# Patient Record
Sex: Female | Born: 1995 | Race: Black or African American | Hispanic: No | Marital: Single | Smoking: Never smoker
Health system: Southern US, Community
[De-identification: ages and names within clinical notes are randomized; demographics above are authoritative.]

## PROBLEM LIST (undated history)

## (undated) DIAGNOSIS — J45909 Unspecified asthma, uncomplicated: Secondary | ICD-10-CM

## (undated) DIAGNOSIS — F419 Anxiety disorder, unspecified: Secondary | ICD-10-CM

---

## 2017-05-23 ENCOUNTER — Emergency Department (HOSPITAL_BASED_OUTPATIENT_CLINIC_OR_DEPARTMENT_OTHER): Payer: 59

## 2017-05-23 ENCOUNTER — Encounter (HOSPITAL_BASED_OUTPATIENT_CLINIC_OR_DEPARTMENT_OTHER): Payer: Self-pay | Admitting: *Deleted

## 2017-05-23 ENCOUNTER — Other Ambulatory Visit: Payer: Self-pay

## 2017-05-23 ENCOUNTER — Emergency Department (HOSPITAL_BASED_OUTPATIENT_CLINIC_OR_DEPARTMENT_OTHER)
Admission: EM | Admit: 2017-05-23 | Discharge: 2017-05-23 | Disposition: A | Discharge status: Home / Self Care | Payer: 59 | Attending: Emergency Medicine | Admitting: Emergency Medicine

## 2017-05-23 DIAGNOSIS — F419 Anxiety disorder, unspecified: Secondary | ICD-10-CM | POA: Insufficient documentation

## 2017-05-23 DIAGNOSIS — J45909 Unspecified asthma, uncomplicated: Secondary | ICD-10-CM | POA: Diagnosis not present

## 2017-05-23 DIAGNOSIS — R0602 Shortness of breath: Secondary | ICD-10-CM | POA: Diagnosis not present

## 2017-05-23 HISTORY — DX: Anxiety disorder, unspecified: F41.9

## 2017-05-23 HISTORY — DX: Unspecified asthma, uncomplicated: J45.909

## 2017-05-23 LAB — BASIC METABOLIC PANEL
ANION GAP: 7 (ref 5–15)
BUN: 9 mg/dL (ref 6–20)
CO2: 23 mmol/L (ref 22–32)
Calcium: 9.2 mg/dL (ref 8.9–10.3)
Chloride: 109 mmol/L (ref 101–111)
Creatinine, Ser: 0.83 mg/dL (ref 0.44–1.00)
GFR calc Af Amer: >60 mL/min (ref >60)
GLUCOSE: 88 mg/dL (ref 65–99)
POTASSIUM: 3.4 mmol/L — AB (ref 3.5–5.1)
Sodium: 139 mmol/L (ref 135–145)

## 2017-05-23 LAB — CBC
HEMATOCRIT: 34.2 % — AB (ref 36.0–46.0)
HEMOGLOBIN: 11.8 g/dL — AB (ref 12.0–15.0)
MCH: 31.5 pg (ref 26.0–34.0)
MCHC: 34.5 g/dL (ref 30.0–36.0)
MCV: 91.2 fL (ref 78.0–100.0)
Platelets: 196 10*3/uL (ref 150–400)
RBC: 3.75 MIL/uL — AB (ref 3.87–5.11)
RDW: 12.1 % (ref 11.5–15.5)
WBC: 5.1 10*3/uL (ref 4.0–10.5)

## 2017-05-23 LAB — D-DIMER, QUANTITATIVE: D-Dimer, Quant: <0.27 ug/mL-FEU (ref 0.00–0.50)

## 2017-05-23 LAB — PREGNANCY, URINE: PREG TEST UR: NEGATIVE

## 2017-05-23 NOTE — ED Provider Notes (Signed)
[facility] EMERGENCY DEPARTMENT Provider Note   CSN: [id number] Arrival date & time: 05/23/17  [id number]     History   Chief Complaint Chief Complaint  Patient presents with  . Shortness of Breath    HPI Carla Cochran is a 21 y.o. female.  Patient presents to the ER for evaluation of shortness of breath.  Patient reports that symptoms have been ongoing for 1 or 2 weeks.  She reports that she gets very short of breath at times, sometimes with exertion, sometimes when she is not doing anything.  She does report that she has had asthma in the past, sometimes she does feel like she is wheezing.  She recently stopped Lexapro and BuSpar, has a history of anxiety disorder.  She is on Depo-Provera, does not take any exogenous estrogen.  She has not had any recent long car rides or plane rides, denies calf pain and swelling.      Past Medical History:  Diagnosis Date  . Anxiety   . Asthma     There are no active problems to display for this patient.   History reviewed. No pertinent surgical history.  OB History    No data available       Home Medications    Prior to Admission medications   Not on File    Family History No family history on file.  Social History Social History   Tobacco Use  . Smoking status: Never Smoker  Substance Use Topics  . Alcohol use: No    Frequency: Never  . Drug use: No     Allergies   Patient has no known allergies.   Review of Systems Review of Systems  Respiratory: Positive for chest tightness and shortness of breath.   Psychiatric/Behavioral: The patient is nervous/anxious.   All other systems reviewed and are negative.    Physical Exam Updated Vital Signs BP 134/74   Pulse 66   Temp 98.4 F (36.9 C)   Resp 18   Ht 5\' 6"  (1.676 m)   Wt 57.6 kg (127 lb)   SpO2 100%   BMI 20.50 kg/m   Physical Exam  Constitutional: She is oriented to person, place, and time. She appears well-developed and  well-nourished. No distress.  HENT:  Head: Normocephalic and atraumatic.  Right Ear: Hearing normal.  Left Ear: Hearing normal.  Nose: Nose normal.  Mouth/Throat: Oropharynx is clear and moist and mucous membranes are normal.  Eyes: Conjunctivae and EOM are normal. Pupils are equal, round, and reactive to light.  Neck: Normal range of motion. Neck supple.  Cardiovascular: Regular rhythm, S1 normal and S2 normal. Exam reveals no gallop and no friction rub.  No murmur heard. Pulmonary/Chest: Effort normal and breath sounds normal. No respiratory distress. She exhibits no tenderness.  Abdominal: Soft. Normal appearance and bowel sounds are normal. There is no hepatosplenomegaly. There is no tenderness. There is no rebound, no guarding, no tenderness at McBurney's point and negative Murphy's sign. No hernia.  Musculoskeletal: Normal range of motion.  Neurological: She is alert and oriented to person, place, and time. She has normal strength. No cranial nerve deficit or sensory deficit. Coordination normal. GCS eye subscore is 4. GCS verbal subscore is 5. GCS motor subscore is 6.  Skin: Skin is warm, dry and intact. No rash noted. No cyanosis.  Psychiatric: She has a normal mood and affect. Her speech is normal and behavior is normal. Thought content normal.  Nursing note and vitals reviewed.  ED Treatments / Results  Labs (all labs ordered are listed, but only abnormal results are displayed) Labs Reviewed  CBC - Abnormal; Notable for the following components:      Result Value   RBC 3.75 (*)    Hemoglobin 11.8 (*)    HCT 34.2 (*)    All other components within normal limits  PREGNANCY, URINE  D-DIMER, QUANTITATIVE (NOT AT [facility])  BASIC METABOLIC PANEL    EKG  EKG Interpretation  Date/Time:  Thursday May 23 2017 02:31:16 EST Ventricular Rate:  61 PR Interval:    QRS Duration: 81 QT Interval:  417 QTC Calculation: 420 R Axis:   88 Text Interpretation:  Sinus rhythm  Normal ECG Confirmed by Gilda CreasePollina, Christopher J [id number]) on 05/23/2017 2:36:15 AM       Radiology Dg Chest 2 View  Result Date: 05/23/2017 CLINICAL DATA:  21 year old female with shortness of breath. EXAM: CHEST  2 VIEW COMPARISON:  None. FINDINGS: The heart size and mediastinal contours are within normal limits. Both lungs are clear. The visualized skeletal structures are unremarkable. IMPRESSION: No active cardiopulmonary disease. Electronically Signed   By: Elgie CollardArash  Radparvar M.D.   On: 05/23/2017 02:23    Procedures Procedures (including critical care time)  Medications Ordered in ED Medications - No data to display   Initial Impression / Assessment and Plan / ED Course  I have reviewed the triage vital signs and the nursing notes.  Pertinent labs & imaging results that were available during my care of the patient were reviewed by me and considered in my medical decision making (see chart for details).     She presents with complaints of shortness of breath.  Patient has been experiencing intermittent episodes of shortness of breath as well as chest tightness.  At times she has had pain in her back as well.  Patient appears comfortable upon arrival.  Her vital signs are normal.  No hypoxia.  No tachycardia.  D-dimer is negative.  She does not have any PE risk factors.  Likewise she does not have any cardiac risk factors.  EKG is normal.  Chest x-ray is clear, no evidence of pulmonary pathology.  Patient likely experiencing withdrawal and recurrent anxiety secondary to stopping her BuSpar and Lexapro.  Patient advised that she should restart her medications and follow-up with her prescriber.  Final Clinical Impressions(s) / ED Diagnoses   Final diagnoses:  Shortness of breath  Anxiety    ED Discharge Orders    None       Pollina, Canary Brimhristopher J, MD 05/23/17 [phone]

## 2017-05-23 NOTE — ED Notes (Addendum)
C/o sharp back pain ,cough , sob and chest tightness x 1 week

## 2017-05-23 NOTE — ED Triage Notes (Addendum)
Pt c/o SOb and chest tightness x 1 week , Pt states stopped lexapro and Buspar x 2 weeks ago , c/o mind racing , unable to sleep. " Im nervous"

## 2017-07-29 ENCOUNTER — Emergency Department (HOSPITAL_COMMUNITY): Payer: 59

## 2017-07-29 ENCOUNTER — Encounter (HOSPITAL_COMMUNITY): Payer: Self-pay | Admitting: Emergency Medicine

## 2017-07-29 ENCOUNTER — Emergency Department (HOSPITAL_COMMUNITY)
Admission: EM | Admit: 2017-07-29 | Discharge: 2017-07-29 | Disposition: A | Discharge status: Home / Self Care | Payer: 59 | Attending: Emergency Medicine | Admitting: Emergency Medicine

## 2017-07-29 DIAGNOSIS — R0602 Shortness of breath: Secondary | ICD-10-CM | POA: Insufficient documentation

## 2017-07-29 DIAGNOSIS — Z79899 Other long term (current) drug therapy: Secondary | ICD-10-CM | POA: Insufficient documentation

## 2017-07-29 DIAGNOSIS — R0981 Nasal congestion: Secondary | ICD-10-CM | POA: Diagnosis not present

## 2017-07-29 DIAGNOSIS — R05 Cough: Secondary | ICD-10-CM | POA: Diagnosis not present

## 2017-07-29 DIAGNOSIS — R059 Cough, unspecified: Secondary | ICD-10-CM

## 2017-07-29 DIAGNOSIS — J45909 Unspecified asthma, uncomplicated: Secondary | ICD-10-CM | POA: Diagnosis not present

## 2017-07-29 LAB — BASIC METABOLIC PANEL
ANION GAP: 12 (ref 5–15)
BUN: 6 mg/dL (ref 6–20)
CHLORIDE: 109 mmol/L (ref 101–111)
CO2: 18 mmol/L — AB (ref 22–32)
Calcium: 9.3 mg/dL (ref 8.9–10.3)
Creatinine, Ser: 0.7 mg/dL (ref 0.44–1.00)
GFR calc Af Amer: >60 mL/min (ref >60)
GLUCOSE: 88 mg/dL (ref 65–99)
Potassium: 3.2 mmol/L — ABNORMAL LOW (ref 3.5–5.1)
Sodium: 139 mmol/L (ref 135–145)

## 2017-07-29 LAB — CBC
HEMATOCRIT: 35.3 % — AB (ref 36.0–46.0)
HEMOGLOBIN: 12.3 g/dL (ref 12.0–15.0)
MCH: 31.1 pg (ref 26.0–34.0)
MCHC: 34.8 g/dL (ref 30.0–36.0)
MCV: 89.4 fL (ref 78.0–100.0)
Platelets: 268 10*3/uL (ref 150–400)
RBC: 3.95 MIL/uL (ref 3.87–5.11)
RDW: 12.3 % (ref 11.5–15.5)
WBC: 6.1 10*3/uL (ref 4.0–10.5)

## 2017-07-29 LAB — I-STAT BETA HCG BLOOD, ED (MC, WL, AP ONLY): I-stat hCG, quantitative: <5 m[IU]/mL (ref <5)

## 2017-07-29 MED ORDER — ALBUTEROL SULFATE HFA 108 (90 BASE) MCG/ACT IN AERS
2.0000 | INHALATION_SPRAY | Freq: Once | RESPIRATORY_TRACT | Status: AC
  Administered 2017-07-29: 2 via RESPIRATORY_TRACT
  Filled 2017-07-29: qty 6.7

## 2017-07-29 MED ORDER — CETIRIZINE HCL 10 MG PO TABS: 10.0000 mg | ORAL_TABLET | Freq: Every day | ORAL | 1 refills | Status: AC

## 2017-07-29 MED ORDER — FLUTICASONE PROPIONATE 50 MCG/ACT NA SUSP: 1.0000 | Freq: Every day | NASAL | 2 refills | Status: AC

## 2017-07-29 MED ORDER — BENZONATATE 100 MG PO CAPS: 100.0000 mg | ORAL_CAPSULE | Freq: Three times a day (TID) | ORAL | 0 refills | Status: AC

## 2017-07-29 MED ORDER — ALBUTEROL SULFATE HFA 108 (90 BASE) MCG/ACT IN AERS: 1.0000 | INHALATION_SPRAY | Freq: Four times a day (QID) | RESPIRATORY_TRACT | 0 refills | Status: AC | PRN

## 2017-07-29 NOTE — ED Triage Notes (Signed)
Pt reports shortness of breath, cp for a couple weeks. States seen at multiple places for the same and diagnosed with post nasal drip.   PCP [facility].

## 2017-07-29 NOTE — ED Notes (Signed)
Pt verbalizes understanding of d/c instructions. Pt received prescriptions. Pt ambulatory at d/c with all belongings.  

## 2017-07-29 NOTE — ED Provider Notes (Signed)
[facility] Provider Note   CSN: [id number] Arrival date & time: 07/29/17  2131     History   Chief Complaint Chief Complaint  Patient presents with  . Shortness of Breath    HPI Carla Cochran is a 22 y.o. female.  The history is provided by the patient and medical records.  Shortness of Breath  Associated symptoms include cough.     22 year old female with history of anxiety, asthma, presenting to the ED with shortness of breath.  States this is been intermittent over the past month or so.  States she has been seen at multiple facilities without significant relief.  States her last visit with urgent care she was prescribed some nasal spray which seemed to help a little.  States she did try taking Mucinex and Benadryl, however both of these caused adverse side effects and made her even more anxious and nervous.  States she does have some chest tightness when her shortness of breath is at its worse (mostly with exposure to cold air or exertional activities).  She denies any fever.  Has had a dry cough.  No hemoptysis.  No nausea or vomiting.  She has no known cardiac history.  States she has not been on medications for asthma in several years and does not remember much about this in general.  Past Medical History:  Diagnosis Date  . Anxiety   . Asthma     There are no active problems to display for this patient.   History reviewed. No pertinent surgical history.  OB History    No data available       Home Medications    Prior to Admission medications   Medication Sig Start Date End Date Taking? Authorizing Provider  ibuprofen (ADVIL,MOTRIN) 200 MG tablet Take 200-400 mg by mouth every 6 (six) hours as needed for headache or mild pain.   Yes [provider]  medroxyPROGESTERone (DEPO-PROVERA) 150 MG/ML injection Inject 150 mg into the muscle every 3 (three) months.   Yes [provider]  Multiple Vitamins-Calcium  (ONE-A-DAY WOMENS PO) Take 1 tablet by mouth daily.    Yes [provider]    Family History History reviewed. No pertinent family history.  Social History Social History   Tobacco Use  . Smoking status: Never Smoker  Substance Use Topics  . Alcohol use: No    Frequency: Never  . Drug use: No     Allergies   Patient has no known allergies.   Review of Systems Review of Systems  HENT: Positive for congestion and sneezing.   Respiratory: Positive for cough and shortness of breath.   All other systems reviewed and are negative.    Physical Exam Updated Vital Signs BP 126/70 (BP Location: Right Arm)   Pulse 77   Temp 97.8 F (36.6 C) (Oral)   Resp 16   Ht 5\' 6"  (1.676 m)   Wt 57.6 kg (127 lb)   SpO2 100%   BMI 20.50 kg/m   Physical Exam  Constitutional: She is oriented to person, place, and time. She appears well-developed and well-nourished.  HENT:  Head: Normocephalic and atraumatic.  Right Ear: Tympanic membrane and ear canal normal.  Left Ear: Tympanic membrane and ear canal normal.  Nose: Mucosal edema present.  Mouth/Throat: Uvula is midline, oropharynx is clear and moist and mucous membranes are normal.  Nasal congestion with PND  Eyes: Conjunctivae and EOM are normal. Pupils are equal, round, and reactive to  light.  Neck: Normal range of motion.  Cardiovascular: Normal rate, regular rhythm and normal heart sounds.  Pulmonary/Chest: Effort normal and breath sounds normal. She has no decreased breath sounds. She has no wheezes. She has no rhonchi.  Abdominal: Soft. Bowel sounds are normal.  Musculoskeletal: Normal range of motion.  Neurological: She is alert and oriented to person, place, and time.  Skin: Skin is warm and dry.  Psychiatric: She has a normal mood and affect.  Nursing note and vitals reviewed.    ED Treatments / Results  Labs (all labs ordered are listed, but only abnormal results are displayed) Labs Reviewed  BASIC  METABOLIC PANEL - Abnormal; Notable for the following components:      Result Value   Potassium 3.2 (*)    CO2 18 (*)    All other components within normal limits  CBC - Abnormal; Notable for the following components:   HCT 35.3 (*)    All other components within normal limits  I-STAT BETA HCG BLOOD, ED ([facility], [facility], [facility] ONLY)    EKG  EKG Interpretation None       Radiology Dg Chest 2 View  Result Date: 07/29/2017 CLINICAL DATA:  Initial evaluation for acute chest pain, shortness of breath. EXAM: CHEST  2 VIEW COMPARISON:  Prior radiograph from 07/23/2016. FINDINGS: The cardiac and mediastinal silhouettes are stable in size and contour, and remain within normal limits. The lungs are normally inflated. No airspace consolidation, pleural effusion, or pulmonary edema is identified. There is no pneumothorax. No acute osseous abnormality identified. IMPRESSION: No active cardiopulmonary disease. Electronically Signed   By: Rise Mu M.D.   On: 07/29/2017 22:21    Procedures Procedures (including critical care time)  Medications Ordered in ED Medications  albuterol (PROVENTIL HFA;VENTOLIN HFA) 108 (90 Base) MCG/ACT inhaler 2 puff (not administered)     Initial Impression / Assessment and Plan / ED Course  I have reviewed the triage vital signs and the nursing notes.  Pertinent labs & imaging results that were available during my care of the patient were reviewed by me and considered in my medical decision making (see chart for details).  22 year old female with intermittent shortness of breath and chest tightness over the past month.  Has been seen multiple times for same.  Does have history of asthma in childhood, not currently on any medications for this.  Does report symptoms are worse with cold exposure and exertional activities.  She is afebrile and nontoxic.  Her exam is benign aside from some nasal congestion and postnasal drip.  Her lungs are clear without wheeze or  rhonchi.  Her vitals are stable.  Labs reviewed, mild hypokalemia.  Chest x-ray is clear.  EKG is nonischemic.  She has no tachycardia or hypoxia to suggest acute PE.  Suspect her symptoms may be related to bronchospasm +/- cold symptoms.  Will treat symptomatically.  Does not currently have PCP-- recommended follow-up with wellness clinic.  Discussed plan with patient, she acknowledged understanding and agreed with plan of care.  Return precautions given for new or worsening symptoms.  Final Clinical Impressions(s) / ED Diagnoses   Final diagnoses:  Shortness of breath  Nasal congestion  Cough    ED Discharge Orders        Ordered    benzonatate (TESSALON) 100 MG capsule  Every 8 hours     07/29/17 2326    cetirizine (ZYRTEC ALLERGY) 10 MG tablet  Daily     07/29/17 2326  fluticasone (FLONASE) 50 MCG/ACT nasal spray  Daily     07/29/17 2326    albuterol (PROVENTIL HFA;VENTOLIN HFA) 108 (90 Base) MCG/ACT inhaler  Every 6 hours PRN     07/29/17 2326       Garlon Hatchet, PA-C 07/29/17 2349    Charlynne Pander, MD 07/30/17 [phone]

## 2017-07-29 NOTE — ED Notes (Signed)
ED Provider at bedside. 

## 2017-07-29 NOTE — Discharge Instructions (Signed)
Take the prescribed medication as directed. Follow-up with the wellness clinic if you continue having ongoing issues-- you can call to set up appt. Return to the ED for new or worsening symptoms.

## 2018-04-18 ENCOUNTER — Emergency Department (HOSPITAL_COMMUNITY)
Admission: EM | Admit: 2018-04-18 | Discharge: 2018-04-18 | Disposition: A | Discharge status: Home / Self Care | Payer: 59 | Attending: Emergency Medicine | Admitting: Emergency Medicine

## 2018-04-18 ENCOUNTER — Emergency Department (HOSPITAL_COMMUNITY): Payer: 59

## 2018-04-18 ENCOUNTER — Other Ambulatory Visit: Payer: Self-pay

## 2018-04-18 ENCOUNTER — Encounter (HOSPITAL_COMMUNITY): Payer: Self-pay | Admitting: Emergency Medicine

## 2018-04-18 DIAGNOSIS — J45909 Unspecified asthma, uncomplicated: Secondary | ICD-10-CM | POA: Insufficient documentation

## 2018-04-18 DIAGNOSIS — M25562 Pain in left knee: Secondary | ICD-10-CM | POA: Diagnosis not present

## 2018-04-18 DIAGNOSIS — Z79899 Other long term (current) drug therapy: Secondary | ICD-10-CM | POA: Insufficient documentation

## 2018-04-18 NOTE — ED Notes (Signed)
Patient given discharge teaching and verbalized understanding. Patient ambulated out of ED with a steady gait. 

## 2018-04-18 NOTE — ED Triage Notes (Signed)
Patient arrived by self from home. Pt c/o of knee pain that started yesterday. Pt doesn't remember anything that could have caused the pain. No hx of other health problems.

## 2018-04-18 NOTE — ED Provider Notes (Signed)
[facility]-EMERGENCY DEPT Provider Note   CSN: [id number] Arrival date & time: 04/18/18  1258     History   Chief Complaint Chief Complaint  Patient presents with  . Knee Pain    HPI Carla Cochran is a 22 y.o. female past medical history significant for anxiety who presents for evaluation of knee pain x1 day.  Patient states that she has pain to the medial aspect of her left knee.  Pain is intermittent in nature.  Pain is worse when she stands or walks on her knee.  States she has been working more than normal and constantly walks at her job for 9 hours a day.  States she was walking yesterday and she did twist however she did not feel pain at that time.  She has not taken anything for pain.  She rates her current pain a 0/10.  Pain does not radiate.  Denies fever, chills, swelling, redness or warmth to left lower extremity.  She states that when she does have pain it is resolved when she sits down and does not walk.  HPI  Past Medical History:  Diagnosis Date  . Anxiety   . Asthma     There are no active problems to display for this patient.   History reviewed. No pertinent surgical history.   OB History   None      Home Medications    Prior to Admission medications   Medication Sig Start Date End Date Taking? Authorizing Provider  albuterol (PROVENTIL HFA;VENTOLIN HFA) 108 (90 Base) MCG/ACT inhaler Inhale 1-2 puffs into the lungs every 6 (six) hours as needed for wheezing. 07/29/17   Garlon Hatchet, PA-C  benzonatate (TESSALON) 100 MG capsule Take 1 capsule (100 mg total) by mouth every 8 (eight) hours. 07/29/17   Garlon Hatchet, PA-C  cetirizine (ZYRTEC ALLERGY) 10 MG tablet Take 1 tablet (10 mg total) by mouth daily. 07/29/17   Garlon Hatchet, PA-C  fluticasone (FLONASE) 50 MCG/ACT nasal spray Place 1 spray into both nostrils daily. 07/29/17   Garlon Hatchet, PA-C  ibuprofen (ADVIL,MOTRIN) 200 MG tablet Take 200-400 mg by mouth every 6 (six)  hours as needed for headache or mild pain.    [provider]  medroxyPROGESTERone (DEPO-PROVERA) 150 MG/ML injection Inject 150 mg into the muscle every 3 (three) months.    [provider]  Multiple Vitamins-Calcium (ONE-A-DAY WOMENS PO) Take 1 tablet by mouth daily.     [provider]    Family History No family history on file.  Social History Social History   Tobacco Use  . Smoking status: Never Smoker  Substance Use Topics  . Alcohol use: No    Frequency: Never  . Drug use: No     Allergies   Patient has no known allergies.   Review of Systems Review of Systems  Constitutional: Negative for activity change, appetite change, chills, diaphoresis, fatigue and fever.  Musculoskeletal: Negative for arthralgias, back pain, gait problem, joint swelling and myalgias.       Intermittent pain to left medial knee at joint line.  Skin: Negative.   Neurological: Negative for weakness and numbness.     Physical Exam Updated Vital Signs BP 115/80 (BP Location: Left Arm)   Pulse 87   Temp (!) 97.5 F (36.4 C) (Oral)   Resp 18   Ht 5\' 6"  (1.676 m)   Wt 53.5 kg   LMP 04/12/2018   SpO2 100%   BMI 19.05  kg/m   Physical Exam  Constitutional: She appears well-developed and well-nourished. No distress.  HENT:  Head: Atraumatic.  Eyes: Pupils are equal, round, and reactive to light.  Neck: Normal range of motion.  Cardiovascular: Normal rate and intact distal pulses.  Pulmonary/Chest: No respiratory distress.  Abdominal: She exhibits no distension.  Musculoskeletal: Normal range of motion.  5/5 strength to bilateral lower extremity.  Normal gait.  Able to flex and extend at bilateral knees without difficulty, full range of motion.  Negative varus valgus on right knee.  Negative varus on left knee, mild tenderness to medial joint line with valgus stress.  Neurological: She is alert.  Intact sensation to sharp and dull.  Skin: Skin is warm and dry.   No edema, erythema, warmth to bilateral lower extremity.  Psychiatric: She has a normal mood and affect.  Nursing note and vitals reviewed.    ED Treatments / Results  Labs (all labs ordered are listed, but only abnormal results are displayed) Labs Reviewed - No data to display  EKG None  Radiology Dg Knee Complete 4 Views Left  Result Date: 04/18/2018 CLINICAL DATA:  Knee pain pain starting yesterday.  No known injury. EXAM: LEFT KNEE - COMPLETE 4+ VIEW COMPARISON:  None. FINDINGS: Osseous alignment is normal. Bone mineralization is normal. No acute or suspicious osseous lesion. No degenerative change. No appreciable joint effusion and adjacent soft tissues are unremarkable. IMPRESSION: Negative. Electronically Signed   By: Bary Richard M.D.   On: 04/18/2018 14:00    Procedures Procedures (including critical care time)  Medications Ordered in ED Medications - No data to display   Initial Impression / Assessment and Plan / ED Course  I have reviewed the triage vital signs and the nursing notes.  Pertinent labs & imaging results that were available during my care of the patient were reviewed by me and considered in my medical decision making (see chart for details).  22 year old otherwise healthy appearing female presents for evaluation of 1 day of knee pain.  Mild tenderness with valgus stress to left knee.  No edema, erythema, warmth to the knee.  Low suspicion for septic joint. Able to ambulate without difficulty.  Full range of motion bilateral lower extremity.  Plain film left knee ordered from triage.  Plain film left knee without fracture or dislocation.  The patient's pain is most likely a sprain or a strain.  Discussed with patient symptomatic treatment with rest, ice, elevation and ibuprofen.  Will wrap knee and Ace bandage and have her follow-up with her primary care provider or orthopedics next week if she still has symptoms.  Discussed with patient reasons to return  to the emergency department.  Patient voiced understanding is agreeable for follow-up.    Final Clinical Impressions(s) / ED Diagnoses   Final diagnoses:  Acute pain of left knee    ED Discharge Orders    None       Sahian Kerney A, PA-C 04/18/18 1453    Terrilee Files, MD 04/19/18 1102

## 2018-04-18 NOTE — Discharge Instructions (Addendum)
Evaluated for knee pain today.  Your x-ray did not show any signs of fracture or dislocation.  Your pain is most likely musculoskeletal nature.  I would recommend rest, ice, elevation of your knee.  He may also take ibuprofen for your pain.  We have wrapped her knee and Ace bandage.  Please follow-up with a primary care provider for reevaluation if her pain has not resolved in 1 week.  Return to the ED for any new or worsening symptoms such as:  Contact a doctor if: The pain does not stop. The pain changes or gets worse. You have a fever along with knee pain. Your knee gives out or locks up. Your knee swells, and becomes worse. Get help right away if: Your knee feels warm. You cannot move your knee. You have very bad knee pain. You have chest pain. You have trouble breathing.

## 2018-06-09 ENCOUNTER — Other Ambulatory Visit: Payer: Self-pay

## 2018-06-09 ENCOUNTER — Encounter (HOSPITAL_COMMUNITY): Payer: Self-pay | Admitting: Emergency Medicine

## 2018-06-09 ENCOUNTER — Emergency Department (HOSPITAL_COMMUNITY)
Admission: EM | Admit: 2018-06-09 | Discharge: 2018-06-09 | Disposition: A | Discharge status: Home / Self Care | Payer: 59 | Attending: Emergency Medicine | Admitting: Emergency Medicine

## 2018-06-09 DIAGNOSIS — R197 Diarrhea, unspecified: Secondary | ICD-10-CM | POA: Diagnosis not present

## 2018-06-09 DIAGNOSIS — M545 Low back pain, unspecified: Secondary | ICD-10-CM

## 2018-06-09 DIAGNOSIS — Z79899 Other long term (current) drug therapy: Secondary | ICD-10-CM | POA: Diagnosis not present

## 2018-06-09 DIAGNOSIS — Z3202 Encounter for pregnancy test, result negative: Secondary | ICD-10-CM | POA: Diagnosis not present

## 2018-06-09 DIAGNOSIS — J45909 Unspecified asthma, uncomplicated: Secondary | ICD-10-CM | POA: Insufficient documentation

## 2018-06-09 DIAGNOSIS — F419 Anxiety disorder, unspecified: Secondary | ICD-10-CM | POA: Insufficient documentation

## 2018-06-09 DIAGNOSIS — R110 Nausea: Secondary | ICD-10-CM | POA: Diagnosis not present

## 2018-06-09 LAB — URINALYSIS, ROUTINE W REFLEX MICROSCOPIC
BILIRUBIN URINE: NEGATIVE
Glucose, UA: NEGATIVE mg/dL
Hgb urine dipstick: NEGATIVE
KETONES UR: NEGATIVE mg/dL
LEUKOCYTES UA: NEGATIVE
NITRITE: NEGATIVE
Protein, ur: 30 mg/dL — AB
Specific Gravity, Urine: 1.016 (ref 1.005–1.030)
pH: 6 (ref 5.0–8.0)

## 2018-06-09 LAB — COMPREHENSIVE METABOLIC PANEL
ALT: 16 U/L (ref 0–44)
AST: 20 U/L (ref 15–41)
Albumin: 5.3 g/dL — ABNORMAL HIGH (ref 3.5–5.0)
Alkaline Phosphatase: 63 U/L (ref 38–126)
Anion gap: 10 (ref 5–15)
BILIRUBIN TOTAL: 1.3 mg/dL — AB (ref 0.3–1.2)
BUN: 9 mg/dL (ref 6–20)
CO2: 22 mmol/L (ref 22–32)
Calcium: 9.6 mg/dL (ref 8.9–10.3)
Chloride: 106 mmol/L (ref 98–111)
Creatinine, Ser: 0.77 mg/dL (ref 0.44–1.00)
GFR calc Af Amer: >60 mL/min (ref >60)
Glucose, Bld: 96 mg/dL (ref 70–99)
POTASSIUM: 3.5 mmol/L (ref 3.5–5.1)
Sodium: 138 mmol/L (ref 135–145)
TOTAL PROTEIN: 9 g/dL — AB (ref 6.5–8.1)

## 2018-06-09 LAB — I-STAT BETA HCG BLOOD, ED (MC, WL, AP ONLY)

## 2018-06-09 LAB — CBC
HCT: 44.5 % (ref 36.0–46.0)
HEMOGLOBIN: 14.7 g/dL (ref 12.0–15.0)
MCH: 30.1 pg (ref 26.0–34.0)
MCHC: 33 g/dL (ref 30.0–36.0)
MCV: 91 fL (ref 80.0–100.0)
NRBC: 0 % (ref 0.0–0.2)
Platelets: 273 10*3/uL (ref 150–400)
RBC: 4.89 MIL/uL (ref 3.87–5.11)
RDW: 12.4 % (ref 11.5–15.5)
WBC: 3.8 10*3/uL — AB (ref 4.0–10.5)

## 2018-06-09 LAB — LIPASE, BLOOD: LIPASE: 71 U/L — AB (ref 11–51)

## 2018-06-09 MED ORDER — ACETAMINOPHEN 325 MG PO TABS
650.0000 mg | ORAL_TABLET | Freq: Once | ORAL | Status: AC
  Administered 2018-06-09: 650 mg via ORAL
  Filled 2018-06-09: qty 2

## 2018-06-09 MED ORDER — NAPROXEN 375 MG PO TABS: 375.0000 mg | ORAL_TABLET | Freq: Two times a day (BID) | ORAL | 0 refills | Status: AC

## 2018-06-09 MED ORDER — ONDANSETRON HCL 4 MG PO TABS: 4.0000 mg | ORAL_TABLET | Freq: Four times a day (QID) | ORAL | 0 refills | Status: AC

## 2018-06-09 MED ORDER — DICYCLOMINE HCL 20 MG PO TABS: 20.0000 mg | ORAL_TABLET | Freq: Two times a day (BID) | ORAL | 0 refills | Status: AC

## 2018-06-09 NOTE — ED Triage Notes (Signed)
Pt complaint of lower mid back pain with n/d; denies emesis or GU symptoms. Onset a week ago.

## 2018-06-09 NOTE — Discharge Instructions (Signed)
Please see attached handouts.  Follow up with your pcp.  If you develop worsening or new concerning symptoms you can return to the emergency department for re-evaluation.

## 2018-06-09 NOTE — ED Provider Notes (Signed)
[facility]-EMERGENCY DEPT Provider Note   CSN: [id number] Arrival date & time: 06/09/18  1016     History   Chief Complaint Chief Complaint  Patient presents with  . Back Pain  . Nausea  . Diarrhea    HPI Carla Cochran is a 22 y.o. female with history of anxiety and asthma who presents emergency department today for back pain.  Patient reports that over the last 1 week she having pain in her mid back without radiation that is constant and describes as a mild, 2/10 achy pain.  She notes that bending forward sometimes make her symptoms worse. She has tried ibuprofen for her symptoms as well as a IcyHot cream.  She notes after Thanksgiving dinner she also began having some nausea as well as episodes of loose, nonbloody, non-melanous stools. She has had some abdominal cramping before diarrhea episodes but no abdominal pain. The patient notes that this is improved since that time.  She denies any associated fever, chills, chest pain, shortness of breath, cough, flank pain, abdominal pain, emesis, urinary frequency, urinary urgency, dysuria, hematuria, vaginal bleeding. Denies history of cancer, trauma, fever,IV drug use, recent spinal manipulation or procedures, upper back pain or neck pain, numbness/tingling/weakness of the lower extremities, radiation of the pain into the extremities, urinary retention, loss of bowel/bladder function, saddle anesthesia, or unexplained weight loss.  HPI  Past Medical History:  Diagnosis Date  . Anxiety   . Asthma     There are no active problems to display for this patient.   History reviewed. No pertinent surgical history.   OB History   None      Home Medications    Prior to Admission medications   Medication Sig Start Date End Date Taking? Authorizing Provider  albuterol (PROVENTIL HFA;VENTOLIN HFA) 108 (90 Base) MCG/ACT inhaler Inhale 1-2 puffs into the lungs every 6 (six) hours as needed for wheezing. 07/29/17  Yes  Garlon Hatchet, PA-C  ibuprofen (ADVIL,MOTRIN) 200 MG tablet Take 200-400 mg by mouth every 6 (six) hours as needed for headache or mild pain.   Yes [provider]  medroxyPROGESTERone (DEPO-PROVERA) 150 MG/ML injection Inject 150 mg into the muscle every 3 (three) months.   Yes [provider]  benzonatate (TESSALON) 100 MG capsule Take 1 capsule (100 mg total) by mouth every 8 (eight) hours. Patient not taking: Reported on 06/09/2018 07/29/17   Garlon Hatchet, PA-C  cetirizine (ZYRTEC ALLERGY) 10 MG tablet Take 1 tablet (10 mg total) by mouth daily. Patient not taking: Reported on 06/09/2018 07/29/17   Garlon Hatchet, PA-C  fluticasone [facility]) 50 MCG/ACT nasal spray Place 1 spray into both nostrils daily. Patient not taking: Reported on 06/09/2018 07/29/17   Garlon Hatchet, PA-C    Family History No family history on file.  Social History Social History   Tobacco Use  . Smoking status: Never Smoker  Substance Use Topics  . Alcohol use: No    Frequency: Never  . Drug use: No     Allergies   Patient has no known allergies.   Review of Systems Review of Systems  All other systems reviewed and are negative.    Physical Exam Updated Vital Signs BP (!) 122/94 (BP Location: Right Arm)   Pulse 88   Temp 97.7 F (36.5 C) (Oral)   Resp 18   Ht 5\' 6"  (1.676 m)   Wt 52.2 kg   SpO2 100%   BMI 18.56 kg/m   Physical  Exam  Constitutional: She appears well-developed and well-nourished. No distress.  Non-toxic appearing  HENT:  Head: Normocephalic and atraumatic.  Right Ear: External ear normal.  Left Ear: External ear normal.  Nose: Nose normal.  Mouth/Throat: Uvula is midline, oropharynx is clear and moist and mucous membranes are normal. No tonsillar exudate.  Eyes: Pupils are equal, round, and reactive to light. Right eye exhibits no discharge. Left eye exhibits no discharge. No scleral icterus.  Neck: Trachea normal and normal range of motion. Neck  supple. No spinous process tenderness present. No neck rigidity. Normal range of motion present.  Cardiovascular: Normal rate, regular rhythm, normal heart sounds and intact distal pulses.  No murmur heard. Pulses:      Radial pulses are 2+ on the right side, and 2+ on the left side.       Femoral pulses are 2+ on the right side, and 2+ on the left side.      Dorsalis pedis pulses are 2+ on the right side, and 2+ on the left side.       Posterior tibial pulses are 2+ on the right side, and 2+ on the left side.  No lower extremity swelling or edema. Calves symmetric in size bilaterally.  Pulmonary/Chest: Effort normal and breath sounds normal. No respiratory distress. She exhibits no tenderness.  Abdominal: Soft. Bowel sounds are normal. She exhibits no distension. There is no tenderness. There is no rigidity, no rebound, no guarding, no CVA tenderness, no tenderness at McBurney's point and negative Murphy's sign.  Musculoskeletal: She exhibits no edema.  Posterior and appearance appears normal. No evidence of obvious scoliosis or kyphosis. No obvious signs of skin changes, trauma, deformity, infection. No C, T, or L spine tenderness or step-offs to palpation. No C, T  paraspinal tenderness. Lumbar paraspinal ttp. Lung expansion normal. Bilateral lower extremity strength 5 out of 5 including extensor hallucis longus. Patellar and Achilles deep tendon reflex 2+ and equal bilaterally. Sensation of lower extremities grossly intact. Straight leg right neg. Straight leg left neg. Gait able but patient notes painful. Lower extremity compartments soft. PT and DP 2+ b/l. Cap refill <2 seconds.   Lymphadenopathy:    She has no cervical adenopathy.  Neurological: She is alert. She has normal strength. No sensory deficit. Gait normal.  No foot drop. Normal gait.   Skin: Skin is warm, dry and intact. Capillary refill takes less than 2 seconds. No rash noted. She is not diaphoretic. No erythema.  No  vesicular-like rash noted.  Psychiatric: She has a normal mood and affect.  Nursing note and vitals reviewed.    ED Treatments / Results  Labs (all labs ordered are listed, but only abnormal results are displayed) Labs Reviewed  LIPASE, BLOOD - Abnormal; Notable for the following components:      Result Value   Lipase 71 (*)    All other components within normal limits  COMPREHENSIVE METABOLIC PANEL - Abnormal; Notable for the following components:   Total Protein 9.0 (*)    Albumin 5.3 (*)    Total Bilirubin 1.3 (*)    All other components within normal limits  CBC - Abnormal; Notable for the following components:   WBC 3.8 (*)    All other components within normal limits  URINALYSIS, ROUTINE W REFLEX MICROSCOPIC - Abnormal; Notable for the following components:   Protein, ur 30 (*)    Bacteria, UA RARE (*)    All other components within normal limits  I-STAT BETA HCG BLOOD,  ED ([facility], [facility], [facility] ONLY)    EKG None  Radiology No results found.  Procedures Procedures (including critical care time)  Medications Ordered in ED Medications - No data to display   Initial Impression / Assessment and Plan / ED Course  I have reviewed the triage vital signs and the nursing notes.  Pertinent labs & imaging results that were available during my care of the patient were reviewed by me and considered in my medical decision making (see chart for details).     22 y.o. female presenting with atraumatic back pain, nausea, abdominal cramping, and loose stools. No neurologic deficits and normal neuro exam.  Patient can walk but states it is painful.  No bowel or bladder incontinence.  No urinary retention or saddle anesthesia.  No concern for cauda equina.  No history of trauma, personal history of cancer, night sweats or weight loss that would warrant a x-ray at this time.  No spinal injections, fever or IV drug use that make me concerned for spinal hematoma or abscess.  No pulsatile mass of  the abdomen.  No abdominal tenderness to palpation or guarding to make me concern for intra-abdominal pathology.  No urinary symptoms or CVA tenderness to make me concerned for cystitis versus pyelonephritis versus kidney stone.  Labs reviewed.  Patient does have mild elevation of lipase (not indicative of pancreatitis) as well as bilirubin.  She has no epigastric or right upper quadrant tenderness palpation.  Her pain is not related to food.  She denies any alcohol use.  Do not feel patient needs CT scan or right upper quadrant ultrasound to evaluate.  Repeat abdominal exam is without any tenderness or peritoneal signs.  No indication of appendicitis, bowel obstruction, cholecystitis, diverticulitis.  Patient's preg test is negative.  No concern for ectopic.  Suspect patient's symptoms are musculoskeletal in nature with possible viral gastroenteritis as well. No evidence of sciatica with neg straight leg raise and no radiation of the pain.  Will tx conservatively. Will recommend pcp follow up.  Strict return precautions discussed.  Patient appears safe for discharge.  Final Clinical Impressions(s) / ED Diagnoses   Final diagnoses:  Nausea  Acute low back pain without sciatica, unspecified back pain laterality    ED Discharge Orders         Ordered    dicyclomine (BENTYL) 20 MG tablet  2 times daily     06/09/18 1319    ondansetron (ZOFRAN) 4 MG tablet  Every 6 hours     06/09/18 1319    naproxen (NAPROSYN) 375 MG tablet  2 times daily     06/09/18 1319           Princella PellegriniMaczis, Davinity Fanara M, PA-C 06/09/18 1320    Tegeler, Canary Brimhristopher J, MD 06/09/18 Barry Brunner1935

## 2020-08-15 IMAGING — CR DG KNEE COMPLETE 4+V*L*
4 series · 4 of 4 positions shown · non-contrast
Comparison: None.

CLINICAL DATA: Knee pain pain starting yesterday.  No known injury.

EXAM:
LEFT KNEE - COMPLETE 4+ VIEW

[t knee ap left]
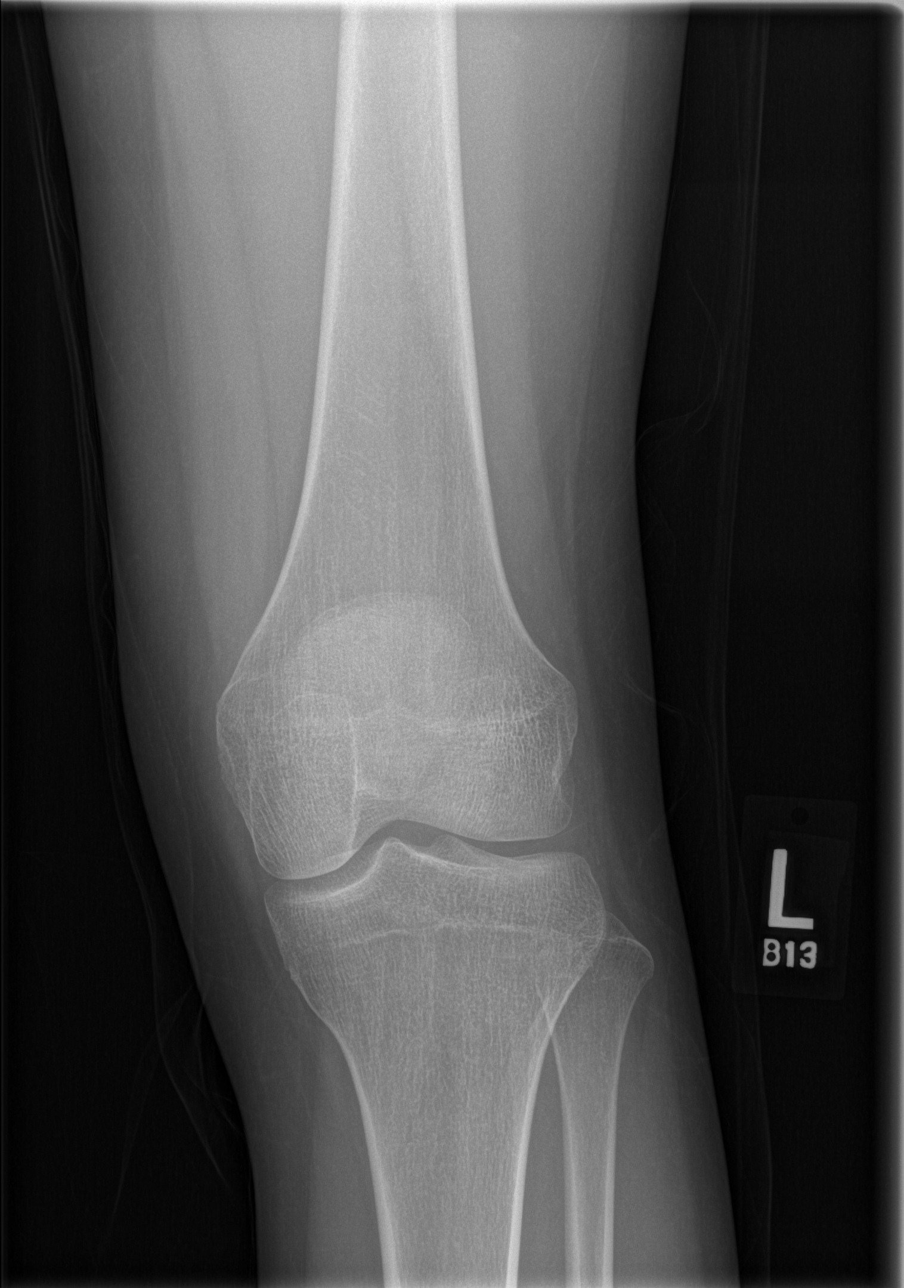

[t knee obl left (1 of 2)]
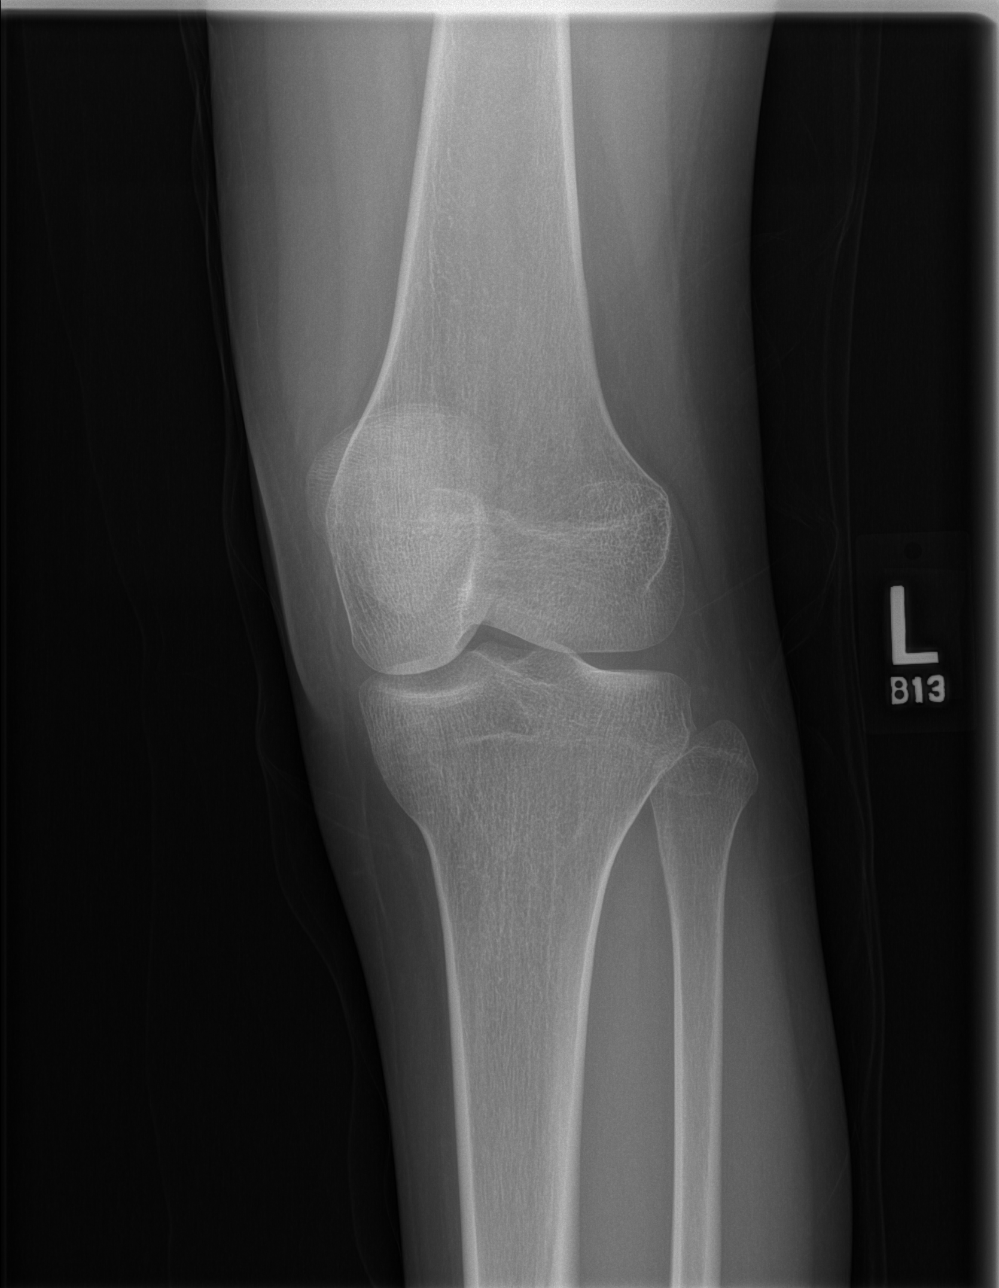

[t knee obl left (2 of 2)]
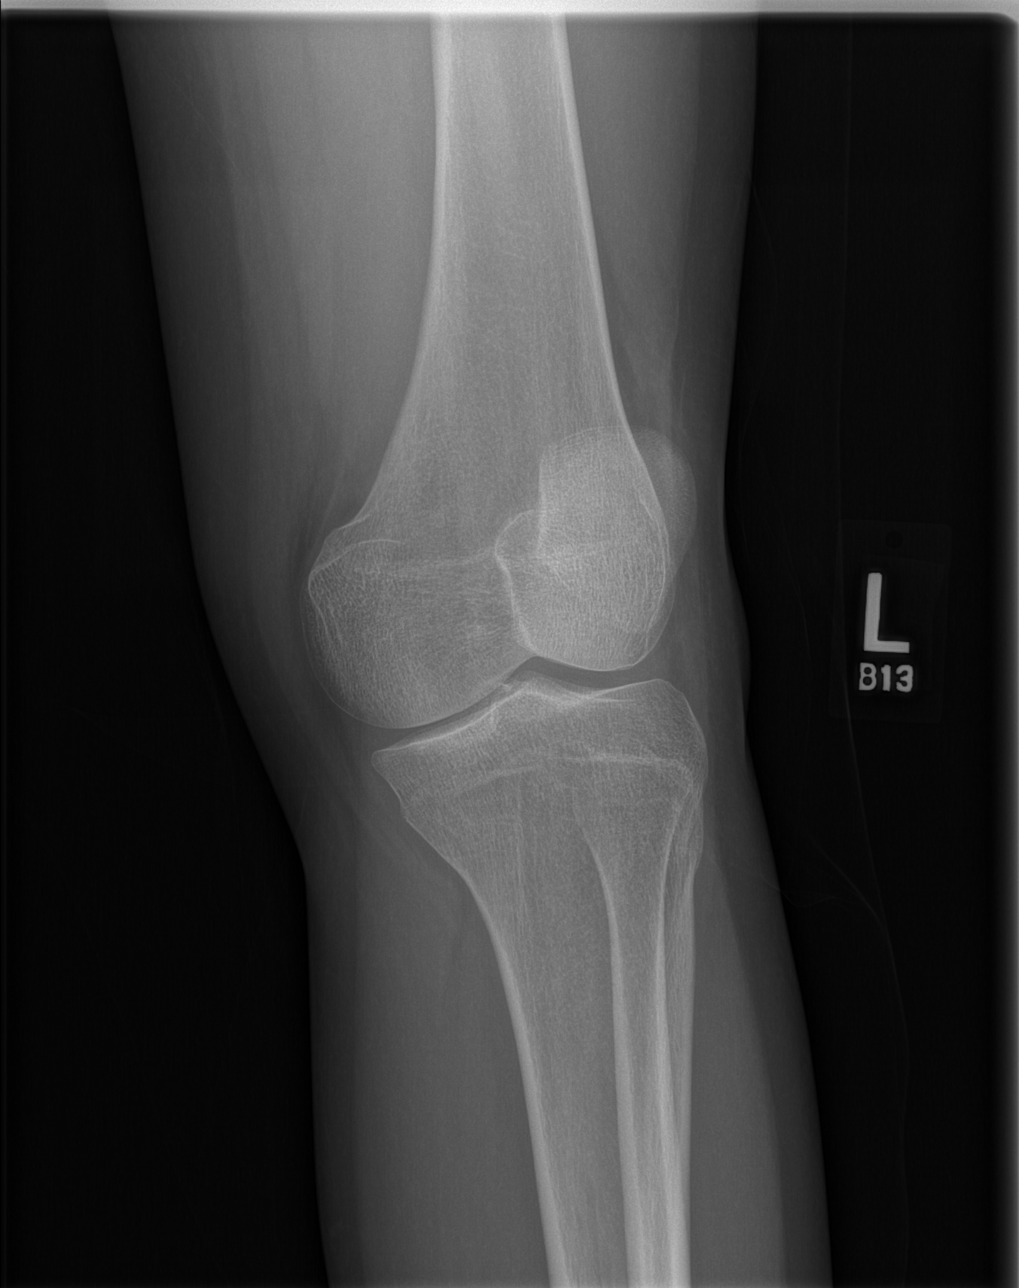

[t knee lat left]
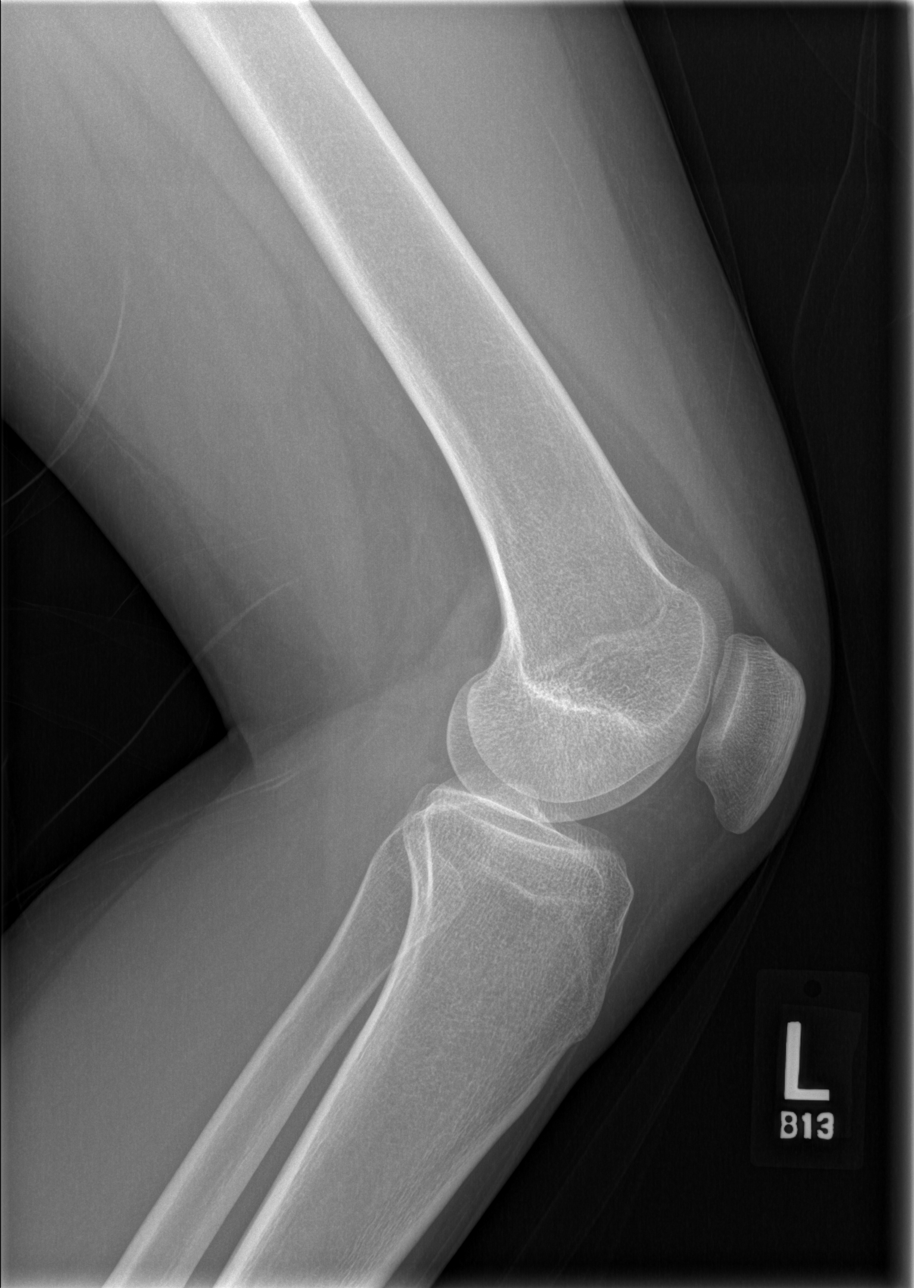

[4 of 4 positions shown; findings below may reference images not displayed]

FINDINGS: Osseous alignment is normal. Bone mineralization is normal. No acute
or suspicious osseous lesion. No degenerative change. No appreciable
joint effusion and adjacent soft tissues are unremarkable.
IMPRESSION: Negative.
